# Patient Record
Sex: Female | Born: 1975 | Race: Black or African American | Hispanic: No | Marital: Single
Health system: Southern US, Community
[De-identification: ages and names within clinical notes are randomized; demographics above are authoritative.]

## PROBLEM LIST (undated history)

## (undated) DIAGNOSIS — K8020 Calculus of gallbladder without cholecystitis without obstruction: Secondary | ICD-10-CM

## (undated) HISTORY — PX: ABDOMINAL HYSTERECTOMY: SHX81

---

## 1998-03-26 ENCOUNTER — Inpatient Hospital Stay (HOSPITAL_COMMUNITY): Admission: AD | Admit: 1998-03-26 | Discharge: 1998-03-26 | Payer: Self-pay | Admitting: *Deleted

## 1998-12-22 ENCOUNTER — Inpatient Hospital Stay (HOSPITAL_COMMUNITY): Admission: AD | Admit: 1998-12-22 | Discharge: 1998-12-22 | Payer: Self-pay | Admitting: *Deleted

## 1999-03-04 ENCOUNTER — Ambulatory Visit (HOSPITAL_COMMUNITY): Admission: RE | Admit: 1999-03-04 | Discharge: 1999-03-04 | Payer: Self-pay | Admitting: *Deleted

## 1999-08-06 ENCOUNTER — Inpatient Hospital Stay (HOSPITAL_COMMUNITY): Admission: AD | Admit: 1999-08-06 | Discharge: 1999-08-09 | Payer: Self-pay | Admitting: *Deleted

## 1999-08-06 ENCOUNTER — Inpatient Hospital Stay (HOSPITAL_COMMUNITY): Admission: AD | Admit: 1999-08-06 | Discharge: 1999-08-06 | Payer: Self-pay | Admitting: *Deleted

## 2001-01-03 ENCOUNTER — Other Ambulatory Visit: Admission: RE | Admit: 2001-01-03 | Discharge: 2001-01-03 | Payer: Self-pay | Admitting: Family Medicine

## 2001-01-23 ENCOUNTER — Emergency Department (HOSPITAL_COMMUNITY): Admission: EM | Admit: 2001-01-23 | Discharge: 2001-01-23 | Payer: Self-pay | Admitting: Emergency Medicine

## 2001-02-18 ENCOUNTER — Emergency Department (HOSPITAL_COMMUNITY): Admission: EM | Admit: 2001-02-18 | Discharge: 2001-02-18 | Payer: Self-pay | Admitting: Emergency Medicine

## 2002-06-18 ENCOUNTER — Inpatient Hospital Stay (HOSPITAL_COMMUNITY): Admission: AD | Admit: 2002-06-18 | Discharge: 2002-06-18 | Payer: Self-pay | Admitting: Obstetrics and Gynecology

## 2002-06-21 ENCOUNTER — Inpatient Hospital Stay (HOSPITAL_COMMUNITY): Admission: AD | Admit: 2002-06-21 | Discharge: 2002-06-21 | Payer: Self-pay | Admitting: *Deleted

## 2002-06-24 ENCOUNTER — Inpatient Hospital Stay (HOSPITAL_COMMUNITY): Admission: AD | Admit: 2002-06-24 | Discharge: 2002-06-24 | Payer: Self-pay | Admitting: *Deleted

## 2002-10-14 ENCOUNTER — Inpatient Hospital Stay (HOSPITAL_COMMUNITY): Admission: AD | Admit: 2002-10-14 | Discharge: 2002-10-14 | Payer: Self-pay | Admitting: *Deleted

## 2002-10-14 ENCOUNTER — Encounter: Payer: Self-pay | Admitting: *Deleted

## 2003-01-13 ENCOUNTER — Ambulatory Visit (HOSPITAL_COMMUNITY): Admission: RE | Admit: 2003-01-13 | Discharge: 2003-01-13 | Payer: Self-pay | Admitting: *Deleted

## 2003-06-05 ENCOUNTER — Inpatient Hospital Stay (HOSPITAL_COMMUNITY): Admission: AD | Admit: 2003-06-05 | Discharge: 2003-06-05 | Payer: Self-pay | Admitting: *Deleted

## 2003-06-08 ENCOUNTER — Encounter (INDEPENDENT_AMBULATORY_CARE_PROVIDER_SITE_OTHER): Payer: Self-pay | Admitting: Specialist

## 2003-06-08 ENCOUNTER — Inpatient Hospital Stay (HOSPITAL_COMMUNITY): Admission: AD | Admit: 2003-06-08 | Discharge: 2003-06-11 | Payer: Self-pay | Admitting: Family Medicine

## 2004-07-18 ENCOUNTER — Inpatient Hospital Stay (HOSPITAL_COMMUNITY): Admission: AD | Admit: 2004-07-18 | Discharge: 2004-07-18 | Payer: Self-pay | Admitting: *Deleted

## 2006-06-05 ENCOUNTER — Emergency Department (HOSPITAL_COMMUNITY): Admission: EM | Admit: 2006-06-05 | Discharge: 2006-06-05 | Payer: Self-pay | Admitting: Family Medicine

## 2008-11-29 ENCOUNTER — Emergency Department (HOSPITAL_COMMUNITY): Admission: EM | Admit: 2008-11-29 | Discharge: 2008-11-29 | Payer: Self-pay | Admitting: Family Medicine

## 2009-10-19 ENCOUNTER — Emergency Department (HOSPITAL_COMMUNITY): Admission: EM | Admit: 2009-10-19 | Discharge: 2009-10-19 | Payer: Self-pay | Admitting: Emergency Medicine

## 2009-10-21 ENCOUNTER — Ambulatory Visit: Payer: Self-pay | Admitting: Obstetrics and Gynecology

## 2009-10-21 ENCOUNTER — Inpatient Hospital Stay (HOSPITAL_COMMUNITY): Admission: AD | Admit: 2009-10-21 | Discharge: 2009-10-21 | Payer: Self-pay | Admitting: Obstetrics & Gynecology

## 2010-03-04 ENCOUNTER — Encounter: Payer: Self-pay | Admitting: Family Medicine

## 2010-03-04 ENCOUNTER — Ambulatory Visit (HOSPITAL_COMMUNITY)
Admission: RE | Admit: 2010-03-04 | Discharge: 2010-03-04 | Payer: Self-pay | Source: Home / Self Care | Admitting: Family Medicine

## 2010-03-29 ENCOUNTER — Inpatient Hospital Stay (HOSPITAL_COMMUNITY)
Admission: AD | Admit: 2010-03-29 | Discharge: 2010-03-29 | Payer: Self-pay | Source: Home / Self Care | Attending: Obstetrics and Gynecology | Admitting: Obstetrics and Gynecology

## 2010-06-21 ENCOUNTER — Inpatient Hospital Stay (HOSPITAL_COMMUNITY)
Admission: AD | Admit: 2010-06-21 | Discharge: 2010-06-21 | Disposition: A | Discharge status: Home / Self Care | Payer: Medicaid Other | Source: Ambulatory Visit | Attending: Obstetrics & Gynecology | Admitting: Obstetrics & Gynecology

## 2010-06-21 DIAGNOSIS — O479 False labor, unspecified: Secondary | ICD-10-CM | POA: Insufficient documentation

## 2010-06-23 ENCOUNTER — Inpatient Hospital Stay (HOSPITAL_COMMUNITY)
Admission: AD | Admit: 2010-06-23 | Discharge: 2010-06-27 | DRG: 766 | Disposition: A | Discharge status: Home / Self Care | Payer: Medicaid Other | Source: Ambulatory Visit | Attending: Obstetrics & Gynecology | Admitting: Obstetrics & Gynecology

## 2010-06-23 DIAGNOSIS — Z302 Encounter for sterilization: Secondary | ICD-10-CM

## 2010-06-23 DIAGNOSIS — O34219 Maternal care for unspecified type scar from previous cesarean delivery: Principal | ICD-10-CM | POA: Diagnosis present

## 2010-06-23 DIAGNOSIS — O9902 Anemia complicating childbirth: Secondary | ICD-10-CM | POA: Diagnosis present

## 2010-06-23 DIAGNOSIS — D573 Sickle-cell trait: Secondary | ICD-10-CM | POA: Diagnosis present

## 2010-06-23 DIAGNOSIS — O480 Post-term pregnancy: Secondary | ICD-10-CM | POA: Diagnosis present

## 2010-06-23 LAB — RPR: RPR Ser Ql: NONREACTIVE

## 2010-06-23 LAB — CBC
HCT: 35 % — ABNORMAL LOW (ref 36.0–46.0)
RDW: 14.3 % (ref 11.5–15.5)
WBC: 9.4 10*3/uL (ref 4.0–10.5)

## 2010-06-24 ENCOUNTER — Other Ambulatory Visit: Payer: Self-pay | Admitting: Obstetrics and Gynecology

## 2010-06-24 DIAGNOSIS — O9902 Anemia complicating childbirth: Secondary | ICD-10-CM

## 2010-06-24 DIAGNOSIS — O34219 Maternal care for unspecified type scar from previous cesarean delivery: Secondary | ICD-10-CM

## 2010-06-24 DIAGNOSIS — D573 Sickle-cell trait: Secondary | ICD-10-CM

## 2010-06-25 LAB — CBC
MCV: 84.5 fL (ref 78.0–100.0)
Platelets: 141 10*3/uL — ABNORMAL LOW (ref 150–400)
RBC: 3.54 MIL/uL — ABNORMAL LOW (ref 3.87–5.11)
WBC: 16.2 10*3/uL — ABNORMAL HIGH (ref 4.0–10.5)

## 2010-06-27 LAB — URINALYSIS, ROUTINE W REFLEX MICROSCOPIC
Bilirubin Urine: NEGATIVE
Nitrite: NEGATIVE
Specific Gravity, Urine: <1.005 — ABNORMAL LOW (ref 1.005–1.030)
Urobilinogen, UA: 0.2 mg/dL (ref 0.0–1.0)
pH: 6.5 (ref 5.0–8.0)

## 2010-06-27 NOTE — Op Note (Addendum)
[name], [name]                 ACCOUNT NO.:  [id number]  MEDICAL RECORD NO.:  [id number]           PATIENT TYPE:  I  LOCATION:  9118                          FACILITY:  [facility]  PHYSICIAN:  Catalina Antigua, MD     DATE OF BIRTH:  11/13/1975  DATE OF PROCEDURE:  06/24/2010 DATE OF DISCHARGE:                              OPERATIVE REPORT   PREOPERATIVE DIAGNOSES:  This is a 35 year old gravida 6, para 3-0-2-3 who was at 82 and 2 weeks' estimated gestational age.  She was in induction of labor with failure to progress and fetal intolerance of labor with repetitive late and variable decelerations as well as undesired fertility.  POSTOPERATIVE DIAGNOSES:  This is a 35 year old gravida 6, para 3-0-2-3 who was at 73 and 2 weeks' estimated gestational age.  She was in induction of labor with failure to progress and fetal intolerance of labor with repetitive late and variable decelerations as well as undesired fertility.  PROCEDURE:  Repeat low transverse cesarean section with bilateral tubal ligation by the Pomeroy method.  SURGEON:  Catalina Antigua, MD and Lucina Mellow, DO  ANESTHESIA:  Epidural.  FINDINGS:  Many adhesions, a female infant in vertex presentation, Apgars 7 at one minute and 9 at five minutes, weight 6 pounds 13 ounces. Cord pH sample was clotted and unable to be processed.  Normal uterus, tubes, ovaries and placenta.  SPECIMENS:  Placenta to Labor and Delivery.  Tubes bilaterally to Pathology.  ESTIMATED BLOOD LOSS:  600 mL.  URINE OUTPUT:  300 mL and clear.  FLUIDS:  1200 mL.  COMPLICATIONS:  None.  INDICATIONS:  This is a 35 year old gravida 6, para 3-0-2-3 who was in induction of labor for nonreactive NST for her postdates NST yesterday and she was started with induction of labor.  She was placed on Pitocin and fetal heart tracing kept dropping with variable decelerations and late decelerations with Pitocin and there was noted to be failure  to progress.  Cesarean section was decided to proceed.  PROCEDURE:  The patient was taken to the operating room where epidural anesthesia was found to be adequate.  She was prepared and draped in the normal sterile fashion in the dorsal supine position with a leftward tilt.  A Pfannenstiel skin incision was then made with the scalpel and carried through to the underlying fascia with the Bovie.  The fascia was incised in the midline and incision was extended laterally with Mayo scissors.  The superior aspect of the fascial incision was grasped with Kocher clamps, elevated and the underlying rectus muscles were dissected off bluntly and with the Mayo scissors.  Attention was then turned to the inferior aspect of the incision which in a similar fashion was grasped, tented up with Kocher clamps and rectus muscles dissected off bluntly with Mayo scissors.  The rectus muscles were separated in the midline with lysis of adhesions.  The peritoneum was identified and entered with use of Metzenbaum scissors and hemostats.  Adhesions were broken down.  The peritoneal incision was extended with breaking down of the adhesions.  The bladder blade was inserted and the peritoneum  was identified and a bladder flap was made.  The bladder blade was reinserted and a thick uterine adhesion was lysed and then the uterine segment was then incised in a transverse fashion with a scalpel.  The uterine incision was then extended laterally and upwardly with bandage scissors.  The bladder blade was removed and the infant's head was delivered atraumatically with some vaginal upward pressure on the infant's head.  The shoulders and body were delivered without difficulty.  The nose and mouth were suctioned with a bulb suction on the field and the cord was clamped and cut.  The infant was given to the awaiting pediatrician and a cord gas sample was obtained that had clotted off.  The cord placenta delivered without  difficulty and was passed off for cord blood donation.  The uterus was cleared of all clots and debris.  The uterine incision was repaired with Vicryl in a running locked fashion.  A second layer of the same suture was then used in a imbricating fashion to obtain excellent hemostasis.  The gutters were cleared of all clots and were irrigated.  Attention was then turned for the tubal and adhesions were lysed to reveal the patient's left tube.  It was followed out to the fimbria.  A Babcock clamp was then used to grasp the tube approximately 4 cm from the cornual region.  A 3 cm segment of the tube was ligated with a free tie of plain gut x2 and excised.  Good hemostasis was noted and the tube was returned to the abdomen.  The right fallopian tube was then identified in a similar fashion, followed out to the fimbria and was ligated with a free tie of plain gut and a 3 cm segment was excised in similar fashion.  Excellent hemostasis was noted and the tube was returned to the abdomen.  The gutters again were cleared of all clots and then the fascia was reapproximated with 0 Vicryl in a running fashion.  The skin was closed with staples.  The patient tolerated the procedure well.  Sponge, lap and needle counts were correct x3.  The patient had received 2 g of Ancef prior to procedure and the patient was taken to the recovery room in stable condition.  The infant went to the nursery in stable condition.    ______________________________ Lucina Mellow, DO   ______________________________ Catalina Antigua, MD    SH/MEDQ  D:  06/24/2010  T:  06/25/2010  Job:  [id number]  Electronically Signed by Lucina Mellow MD on 06/27/2010 11:14:52 AM Electronically Signed by Catalina Antigua  on 07/05/2010 02:05:23 PM

## 2010-07-03 LAB — URINALYSIS, ROUTINE W REFLEX MICROSCOPIC
Nitrite: POSITIVE — AB
Protein, ur: NEGATIVE mg/dL
Specific Gravity, Urine: 1.02 (ref 1.005–1.030)
Urobilinogen, UA: 0.2 mg/dL (ref 0.0–1.0)

## 2010-07-03 LAB — URINE CULTURE: Colony Count: >=100000

## 2010-07-03 LAB — URINE MICROSCOPIC-ADD ON

## 2010-07-15 NOTE — Discharge Summary (Signed)
  [name], [name]                 ACCOUNT NO.:  [id number]  MEDICAL RECORD NO.:  [id number]           PATIENT TYPE:  I  LOCATION:  9118                          [facility]ILITY:  [facility]  PHYSICIAN:  Allie Bossier, MD        DATE OF BIRTH:  30-Jan-1976  DATE OF ADMISSION:  06/23/2010 DATE OF DISCHARGE:  06/27/2010                              DISCHARGE SUMMARY   REASON FOR ADMISSION:  Pregnancy at term and reactive labor.  SURGICAL PROCEDURE PERFORMED:  Repeat low transverse cesarean section and bilateral tubal ligation with Pomeroy procedure by Dr. Jolayne Panther and Dr. Natale Milch.  HOSPITAL COURSE:  The patient was up ambulating well, taking p.o. fluids and solids well, has had a bowel movement already, and vital signs are stable.  FOLLOWUP:  [address] [facility] nurse is to follow up on postop day 5 through 7 to take out her staples.  She is to follow up at [facility] in 6 weeks postpartum for her postpartum checkup.  DIET:  Diet as tolerated.  ACTIVITY LEVEL:  No heavy lifting or driving for 2 weeks postop.  PHYSICAL EXAMINATION:  VITAL SIGNS:  This morning, vital signs were stable. HEART:  Regular rhythm and rate. LUNGS:  Clear to auscultation bilaterally. ABDOMEN:  Soft.  Bowel sounds present in all four quadrants.  Incision is intact.  There is no redness, swelling or drainage.  Fundus is firm. Lochia small amount. EXTREMITIES:  No edema in lower extremities.  DISCHARGE MEDICATIONS: 1. Percocet 5/325 one p.o. q.4 h p.r.n. pain. 2. Motrin 600 p.o. q.6 h p.r.n. cramping.  Discharge hemoglobin is 9.9, discharge hematocrit is 29.9, white count 16.2, platelets are 141,000.     [name], N.M.   ______________________________ Allie Bossier, MD    DL/MEDQ  D:  16/01/9603  T:  06/27/2010  Job:  [id number]  cc:   [facility]  Electronically Signed by Wyvonnia Dusky N.M. on 07/03/2010 09:14:13 AM Electronically Signed by Nicholaus Bloom MD on  07/15/2010 11:29:58 AM

## 2010-07-31 ENCOUNTER — Inpatient Hospital Stay (HOSPITAL_COMMUNITY): Admission: AD | Admit: 2010-07-31 | Payer: Self-pay | Admitting: Obstetrics & Gynecology

## 2010-09-02 NOTE — Discharge Summary (Signed)
[name], [name]                           ACCOUNT NO.:  [id number]   MEDICAL RECORD NO.:  [id number]                   PATIENT TYPE:  INP   LOCATION:  9130                                 [facility]ILITY[facility]  [facility]   PHYSICIAN:  Lesly Dukes, M.D.              DATE OF BIRTH:  1975-08-07   DATE OF ADMISSION:  06/08/2003  DATE OF DISCHARGE:  06/11/2003                                 DISCHARGE SUMMARY   DISCHARGE DIAGNOSES:  1. Primary low transverse cesarean section at 39 weeks secondary to failure     to progress.  2. Muscle spasm of the right shoulder.   DISCHARGE MEDICATIONS:  1. Ibuprofen 600 mg p.o. q.6h. p.r.n., #20.  2. Percocet 5/325 one p.o. q.4h. p.r.n., #20.  3. Flexeril 5 mg p.o. t.i.d. x2 weeks.  4. Depo-Provera shot given before discharge on June 11, 2003.   FOLLOW UP:  The patient has an appointment at [facility] in 6 weeks.   BRIEF ADMISSION HISTORY:  This is a 35 year old African-American female, G5,  P3-0-1-3, who presented at 80 and 1 with onset of labor.  The patient had a  history of being GBS positive.  Cervical exam was 3 cm and 70% on admission.   HOSPITAL COURSE:  The patient received an epidural.  External fetal  monitoring was reassuring initially.  The patient had artificial rupture of  membranes with thick meconium.  Amnioinfusion was started.  The patient had  variable decelerations with a few late decelerations about 1430 on June 08, 2003.  Amnioinfusion was stopped secondary to no return, and the patient  was started on ampicillin and gentamicin with a temperature of 99.9.  The  patient's cervix did not change after 3-1/2 hours, and it was decided to  take the patient for a cesarean section.  She had a primary low transverse.  Delivery of the infant was on June 08, 2003 at 1649.  Delivery of  placenta at 1652.  Apgars of 1 minute of 4, Apgars at 5 minutes of 8.  She  had a female infant.  The patient is bottle feeding.  The infant  weighs 7  pounds, 0 ounces, with length of 20 inches.  She received penicillin before  being started on the ampicillin and gentamicin earlier in labor.  The  patient and infant stable at discharge.  No complications postoperatively.  The patient did have muscle spasm of right trapezius.  She received heat and  Tylenol during hospitalization, without improvement.  The patient was sent  home with Flexeril 5 mg p.o. t.i.d. for 2 weeks.     Anastasio Auerbach, MD                          Lesly Dukes, M.D.    AD/MEDQ  D:  06/11/2003  T:  06/11/2003  Job:  [id number]

## 2010-09-02 NOTE — Op Note (Signed)
[name], [name]                           ACCOUNT NO.:  [id number]   MEDICAL RECORD NO.:  [id number]                   PATIENT TYPE:  INP   LOCATION:  9130                                 [facility]ILITY:  [facility]   PHYSICIAN:  Lesly Dukes, M.D.              DATE OF BIRTH:  05-31-1975   DATE OF PROCEDURE:  06/08/2003  DATE OF DISCHARGE:                                 OPERATIVE REPORT   PROCEDURE:  Low transverse cesarean section.   PRIMARY SURGEON:  Dr. Penne Lash.   FIRST ASSISTANT:  Dr. Gala Murdoch.   INDICATION:  Failure to progress.  Nonreassuring fetal heart tones.   DESCRIPTION OF PROCEDURE:  Informed consent for cesarean section was  obtained and verified from patient.  The patient was brought back to the  operating room and prepared on the OR table.  The patient had epidural  anesthesia prior to coming back to the OR.  The patient was prepped with an  iodine solution on the abdomen and then was sterilely draped in the usual  fashion.  Adequate anesthesia was tested with Allis clamps prior to  beginning procedure.  Pfannenstiel skin incision was performed using a 15  blade scalpel.  Subcutaneous tissues were dissected down to the fascia using  the Bovie.  Fascia was incised using the Bovie, and fascial incision was  adequately widened using Mayo scissors.  Fascia was elevated with Kocher  clamps, and rectus muscles were dissected off the fascia both inferiorly and  superiorly.  The peritoneum was entered bluntly and peritoneum and rectus  muscles were stretched open laterally.  Bladder blade was inserted, and a  bladder flap was created on the vesicouterine peritoneum using Metzenbaum  scissors.  Bladder flap was retracted down underneath bladder blade.  Uterus  was entered sharply using 15 scalpel and after initial midline incision,  uterine incision was extended superior laterally using bandage scissors in  order to gain adequate access for removal of fetus.  The fetal head was  delivered and was DeLee suctioned on the abdomen prior to delivering the  body.  Nuchal cord was decompressed x 1, and fetus was delivered, a female  fetus at 11.  Cord was clamped and cut and infant with Apgars 4 at 1  minute and 8 at 4 minutes was handed to the waiting MICU team.  Cord gas,  both arterial and venous was sent as well as cord blood.  Placenta delivered  spontaneously directly after delivery of the fetus.  Uterine cavity was  swept with a dry lap in order to assure that all placental membranes had  been removed.  Uterus was then massaged until firm.  Margins of the uterine  incision were identified, and Allis clamps were placed on the margins.  Uterine incision was then closed using a running locking 0 Vicryl suture.  Two 0 Vicryl sutures were used to close the incision.  An additional  figure-  of-eight stitch was added above the incision, the right lateral side for  better hemostasis.  Peritoneal cavity was lavaged with saline solution to  verify that adequate hemostasis had been obtained. After adequate hemostasis  had been obtained, fascial layer was closed using 0 Vicryl suture.  Subcutaneous tissues were then irrigated to assess for hemostasis.  Subcutaneous tissue once was verified, the skin incision was closed using  staples.  Estimated blood loss for the procedure was 1000 mL.  Mother was in  stable condition at the end of the procedure.     [name], MD                          Lesly Dukes, M.D.    CH/MEDQ  D:  06/08/2003  T:  06/08/2003  Job:  [id number]

## 2010-11-23 ENCOUNTER — Other Ambulatory Visit: Payer: Self-pay | Admitting: Internal Medicine

## 2010-11-23 DIAGNOSIS — N926 Irregular menstruation, unspecified: Secondary | ICD-10-CM

## 2010-11-29 ENCOUNTER — Ambulatory Visit
Admission: RE | Admit: 2010-11-29 | Discharge: 2010-11-29 | Disposition: A | Discharge status: Home / Self Care | Payer: Medicaid Other | Source: Ambulatory Visit | Attending: Internal Medicine | Admitting: Internal Medicine

## 2010-11-29 ENCOUNTER — Ambulatory Visit
Admission: RE | Admit: 2010-11-29 | Discharge: 2010-11-29 | Disposition: A | Discharge status: Home / Self Care | Payer: 59 | Source: Ambulatory Visit | Attending: Internal Medicine | Admitting: Internal Medicine

## 2010-11-29 DIAGNOSIS — N926 Irregular menstruation, unspecified: Secondary | ICD-10-CM

## 2012-05-19 IMAGING — US US OB DETAIL+14 WK
1 series · 12 of 28 positions shown · non-contrast
Comparison: none

[Series 1: us ob detail +14 wk · 12 of 52 slices shown]
[im 2/52]
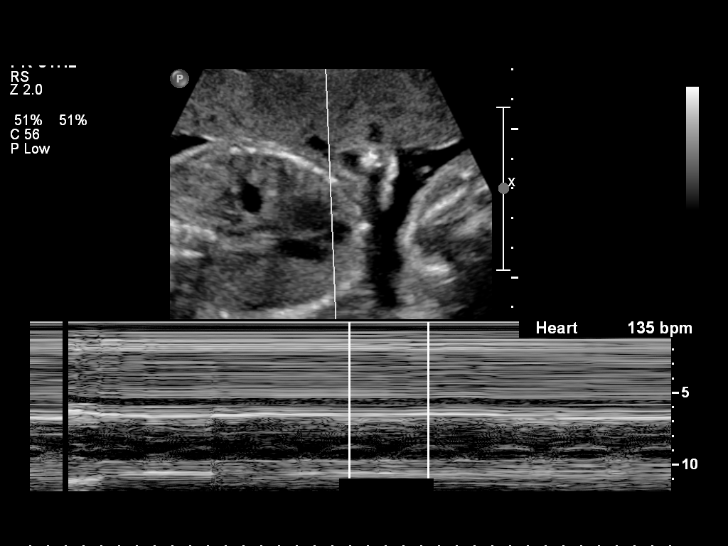
[im 6/52]
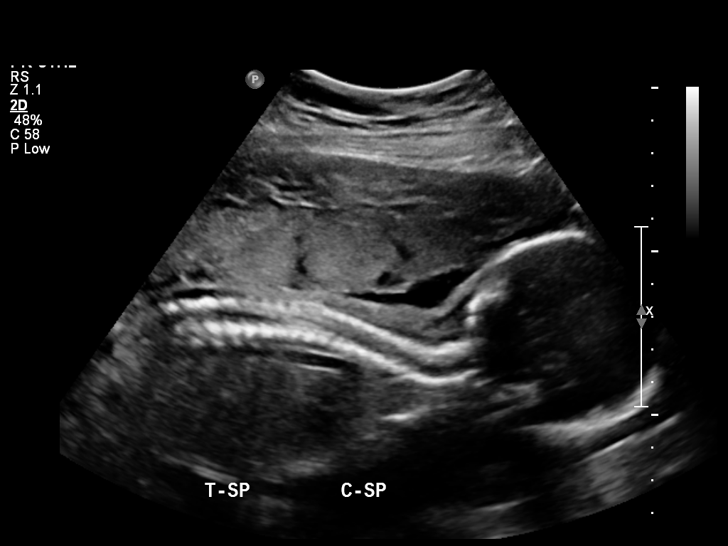
[im 10/52]
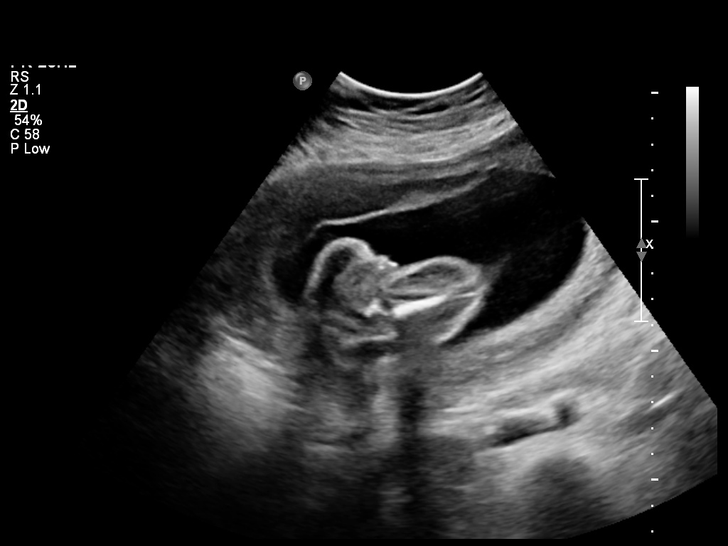
[im 16/52]
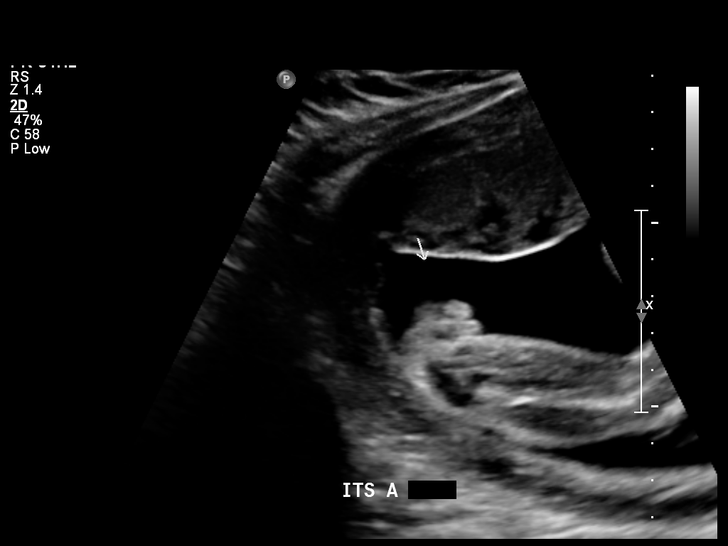
[im 19/52]
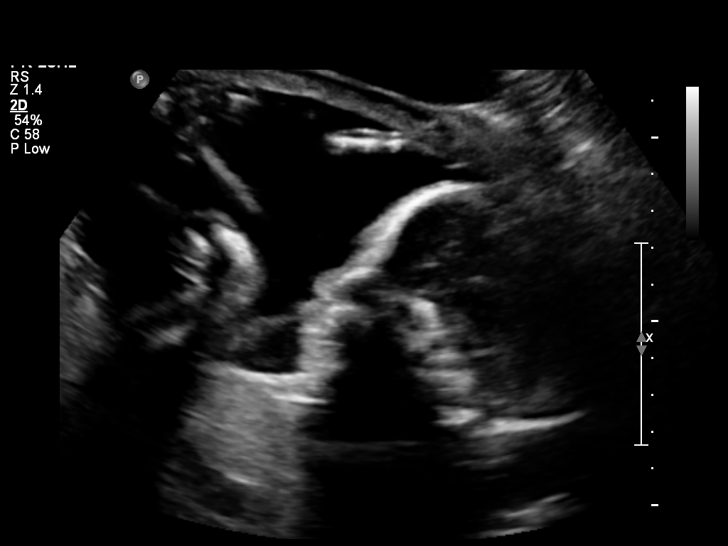
[im 23/52]
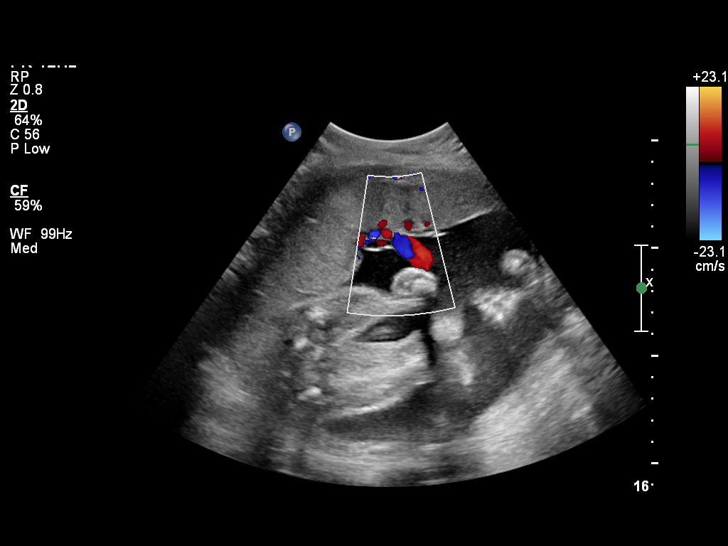
[im 29/52]
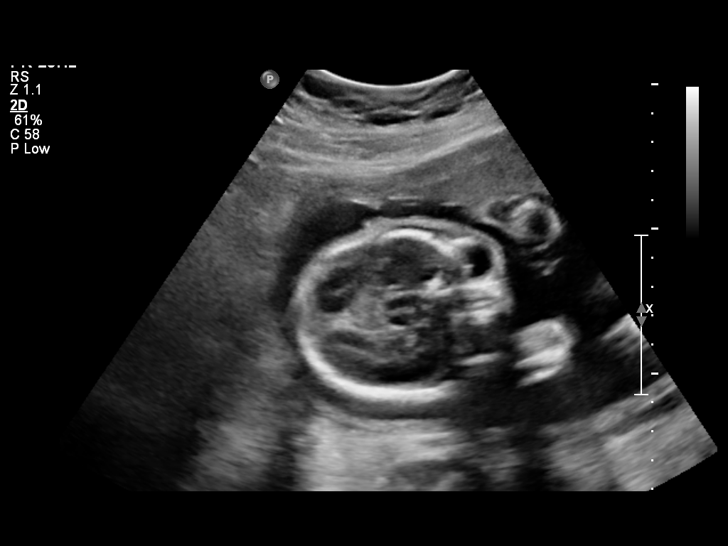
[im 33/52]
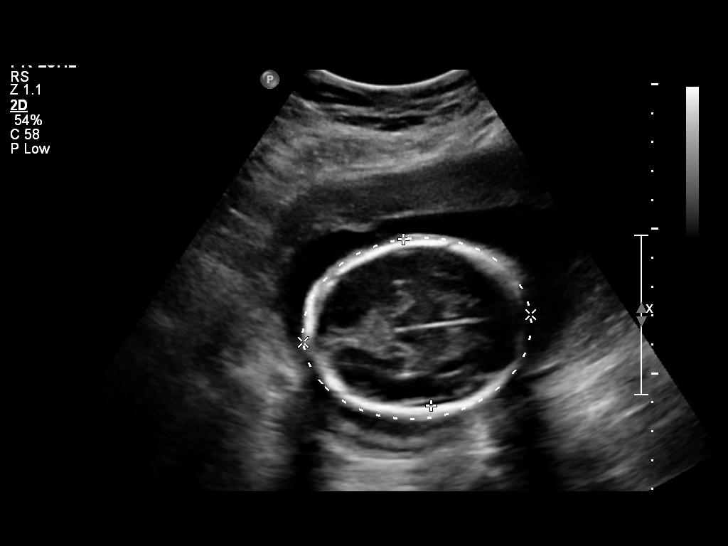
[im 36/52]
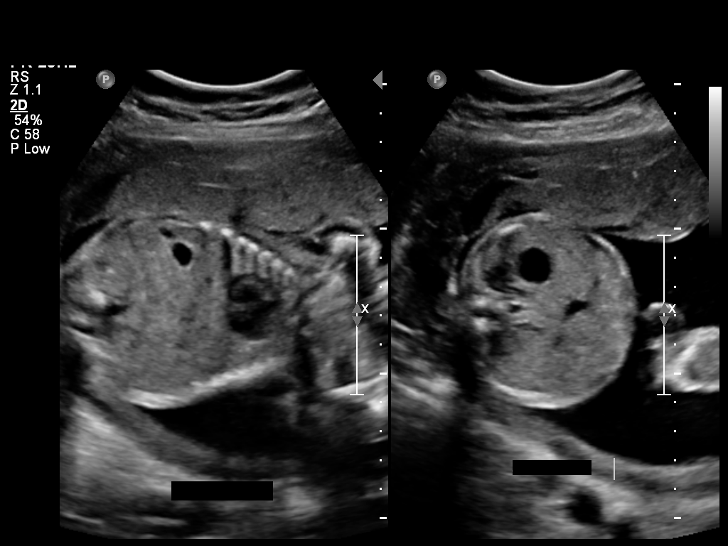
[im 42/52]
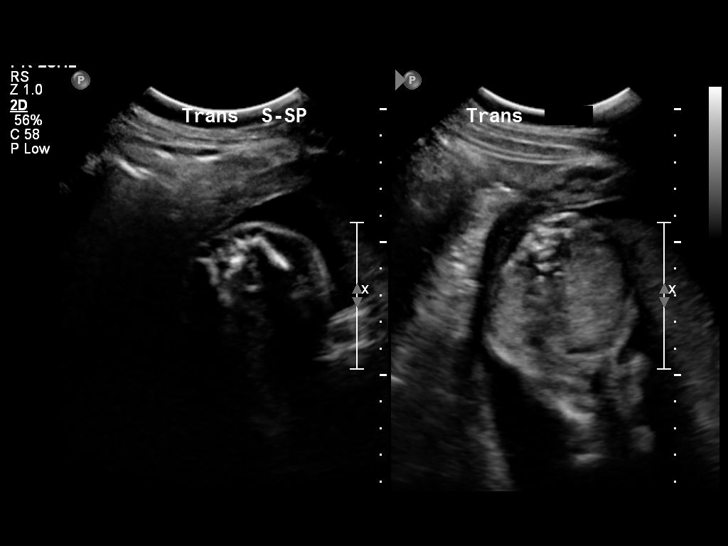
[im 46/52]
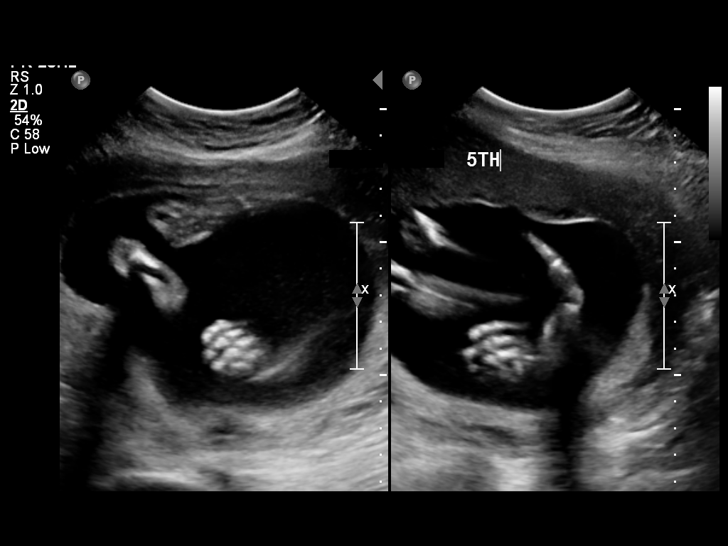
[im 50/52]
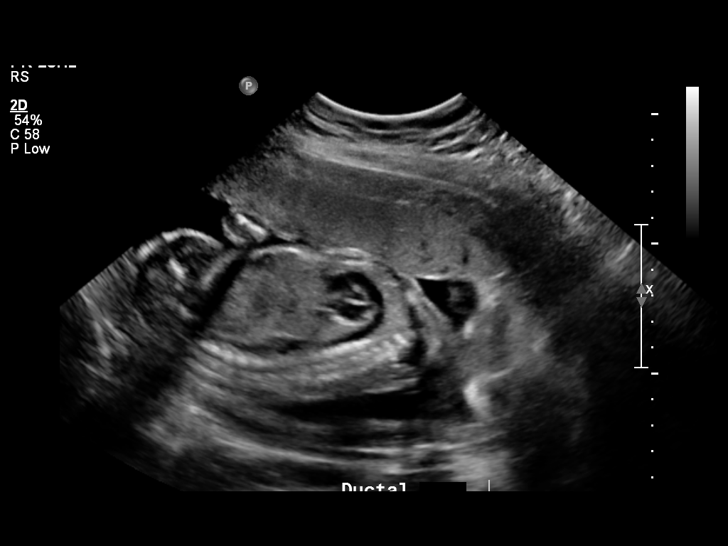

[12 of 28 positions shown; findings below may reference images not displayed]

OBSTETRICS REPORT
                      (Signed Final 03/04/2010 [DATE])

 Order#:         _O
Procedures

 US OB DETAIL +14 WK                                   76811.0
Indications

 Detailed fetal anatomic survey
Fetal Evaluation

 Fetal Heart Rate:  135                          bpm
 Cardiac Activity:  Observed
 Presentation:      Cephalic
 Placenta:          Anterior, above cervical os
 P. Cord            Visualized
 Insertion:

 Amniotic Fluid
 AFI FV:      Subjectively within normal limits
                                             Larg Pckt:     5.6  cm
Biometry

 BPD:     58.2  mm     G. Age:  23w 6d                CI:        69.16   70 - 86
                                                      FL/HC:      20.6   18.7 -

 HC:     223.5  mm     G. Age:  24w 2d       35  %    HC/AC:      1.11   1.05 -

 AC:     201.2  mm     G. Age:  24w 5d       56  %    FL/BPD:     79.0   71 - 87
 FL:        46  mm     G. Age:  25w 2d       68  %    FL/AC:      22.9   20 - 24
 HUM:     42.3  mm     G. Age:  25w 3d       69  %

 Est. FW:     742  gm    1 lb 10 oz      63  %
Gestational Age

 LMP:           24w 2d        Date:  09/15/09                 EDD:   06/22/10
 U/S Today:     24w 4d                                        EDD:   06/20/10
 Best:          24w 2d     Det. By:  LMP  (09/15/09)          EDD:   06/22/10
Anatomy

 Cranium:           Appears normal      Aortic Arch:       Appears normal
 Fetal Cavum:       Appears normal      Ductal Arch:       Appears normal
 Ventricles:        Appears normal      Diaphragm:         Appears normal
 Choroid Plexus:    Appears normal      Stomach:           Appears normal
 Cerebellum:        Appears normal      Abdomen:           Appears normal
 Posterior Fossa:   Appears normal      Abdominal Wall:    Appears nml
                                                           (cord insert,
                                                           abd wall)
 Nuchal Fold:       Appears normal      Cord Vessels:      Appears normal
                    (neck, nuchal                          (3 vessel cord)
                    fold)
 Face:              Lips and orbits     Kidneys:           Appear normal
                    appear normal
 Heart:             Appears normal      Bladder:           Appears normal
                    (4 chamber &
                    axis)
 RVOT:              Appears normal      Spine:             Appears normal
 LVOT:              Appears normal      Limbs:             Four extremities
                                                           seen

 Other:     Female gender. Heels and 5th digit visualized. Nasal
            bone visualized.
Cervix Uterus Adnexa

 Cervical Length:    3.87     cm

 Cervix:       Closed.

 Adnexa:     No abnormality visualized.
Impression

 Single live IUP in cephalic presentation.  Concordant
 measurements/assigned GA by LMP.
 No anatomic abnormality seen with a good quality survey
 possible.

 questions or concerns.

## 2013-02-13 IMAGING — US US PELVIS COMPLETE
1 series · 14 of 25 positions shown · non-contrast
Comparison: OB ultrasound 03/04/2010, pelvic ultrasound 07/18/2004

CLINICAL DATA: Menometrorrhagia

TRANSABDOMINAL AND TRANSVAGINAL ULTRASOUND OF PELVIS
TECHNIQUE: Both transabdominal and transvaginal ultrasound
examinations of the pelvis were performed. Transabdominal technique
was performed for global imaging of the pelvis including uterus,
ovaries, adnexal regions, and pelvic cul-de-sac.

[Series 1: us pelvis complete · 0.31mm/px · 14 of 47 slices shown]
[im 1/47]
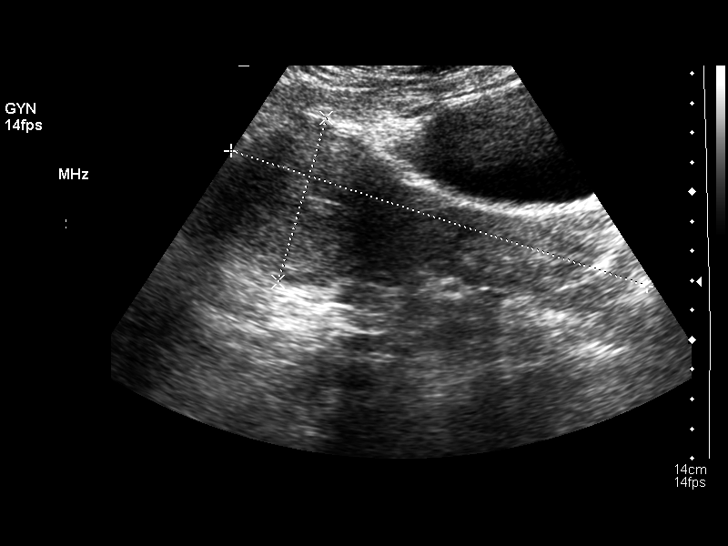
[im 4/47]
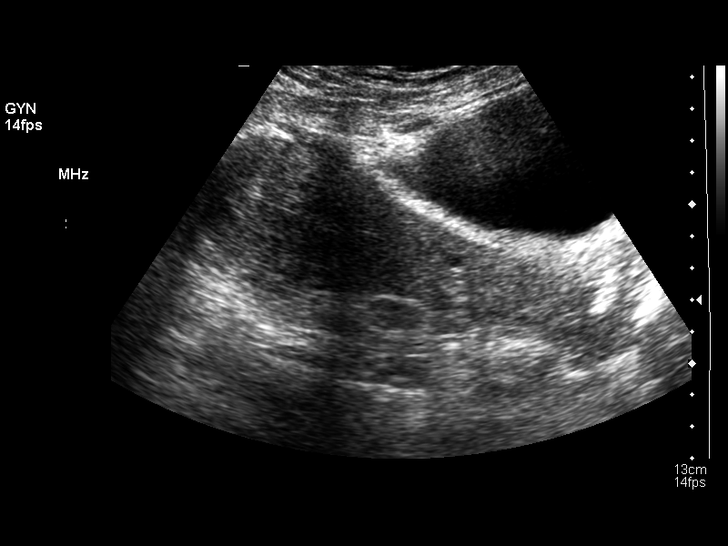
[im 8/47]
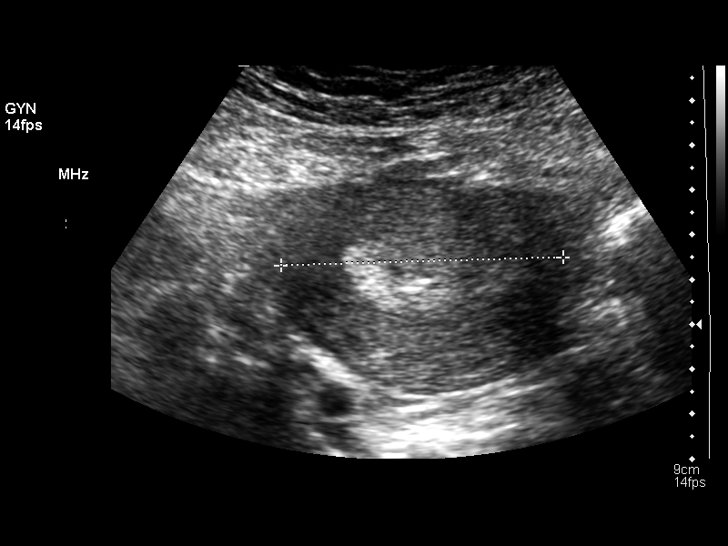
[im 12/47]
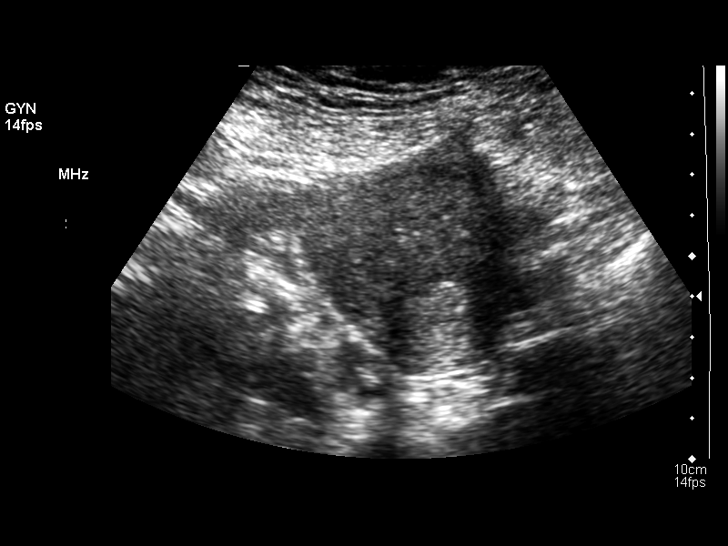
[im 16/47]
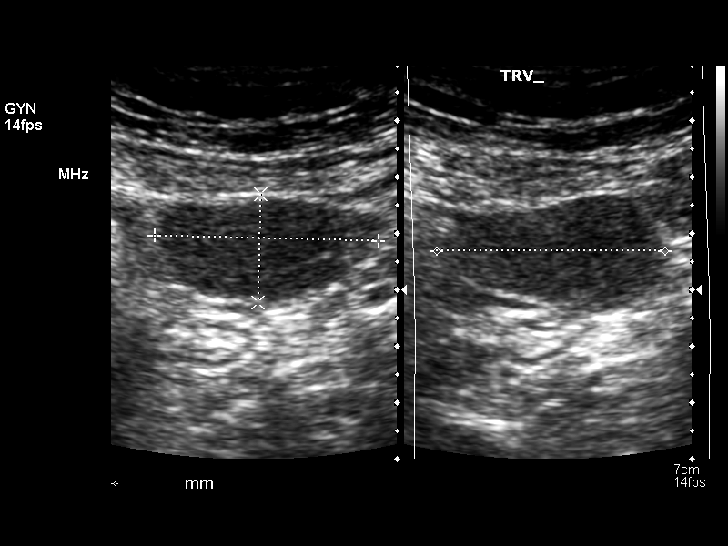
[im 18/47]
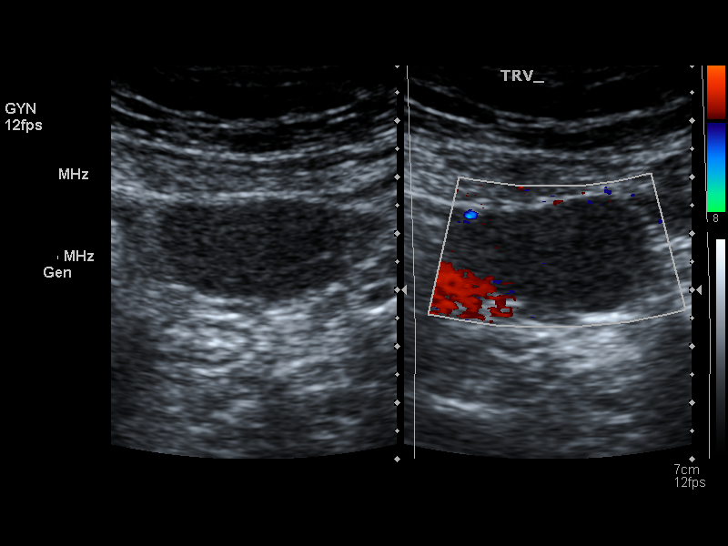
[im 22/47]
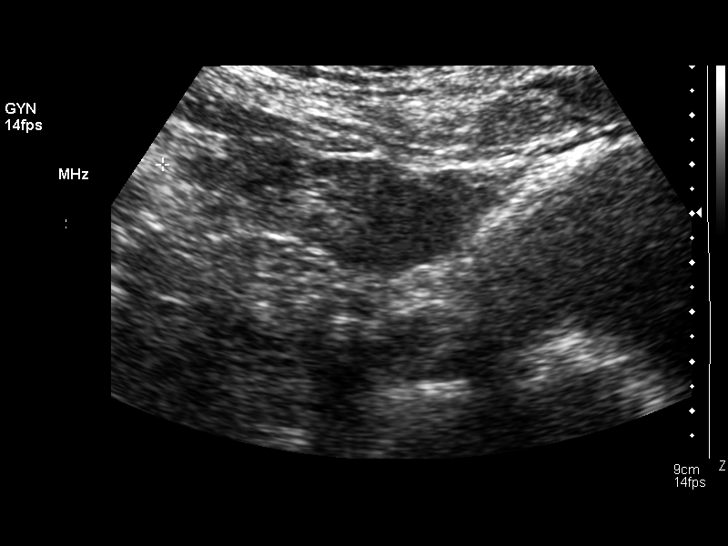
[im 25/47]
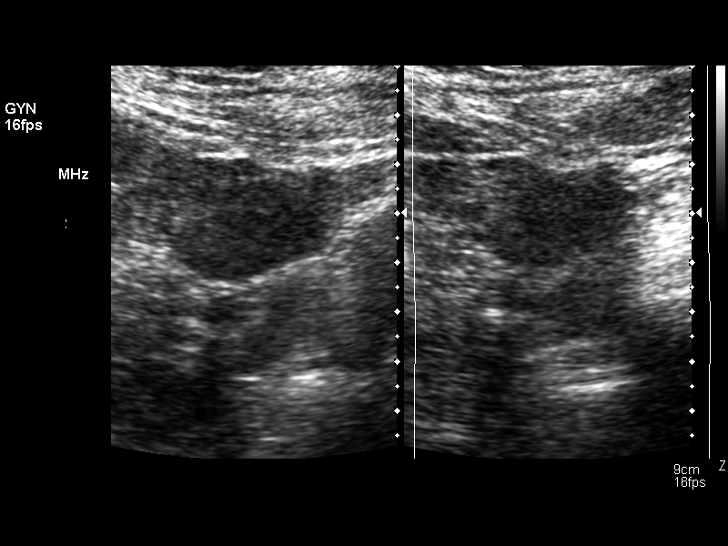
[im 29/47]
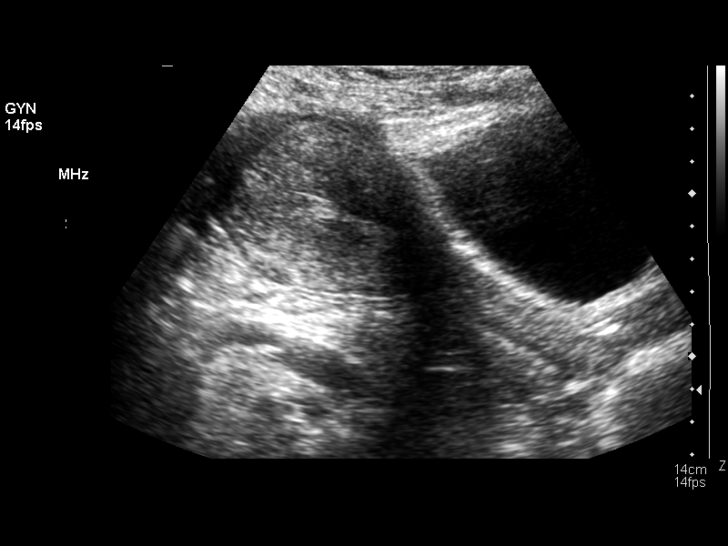
[im 31/47]
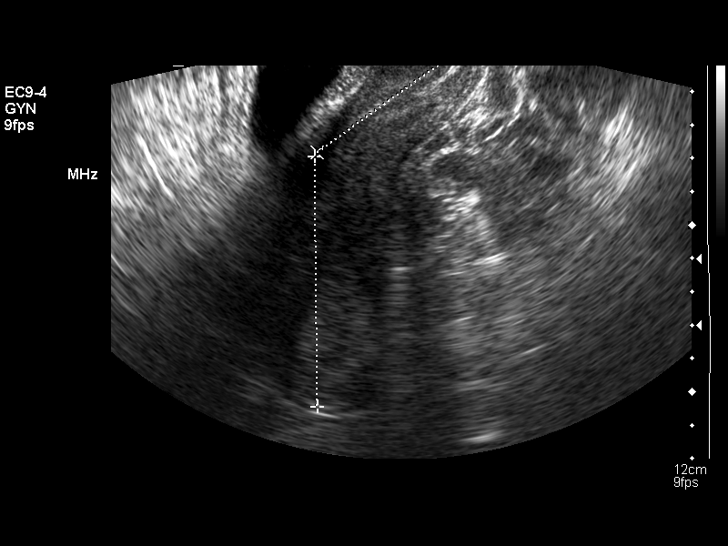
[im 35/47]
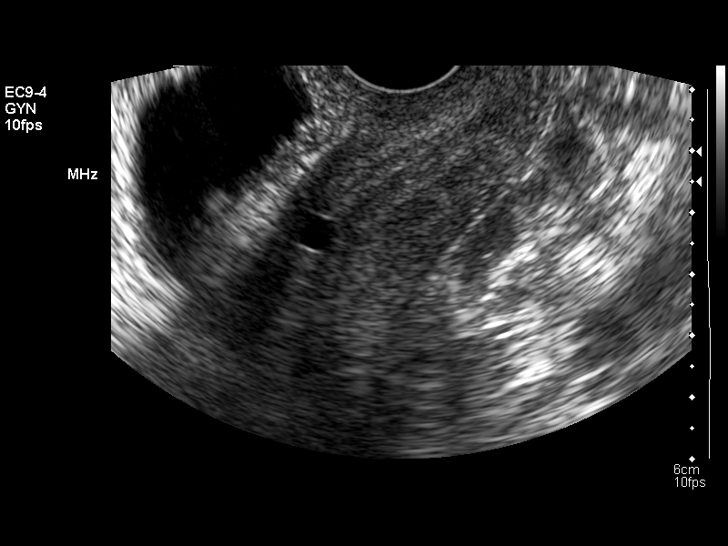
[im 39/47]
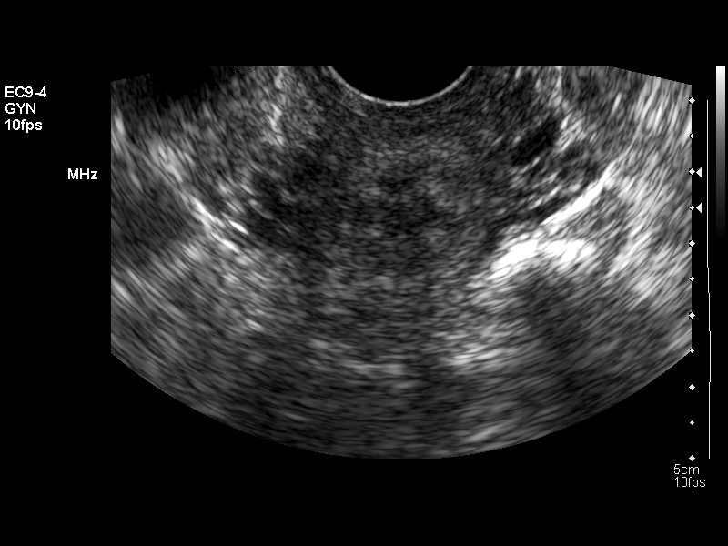
[im 43/47]
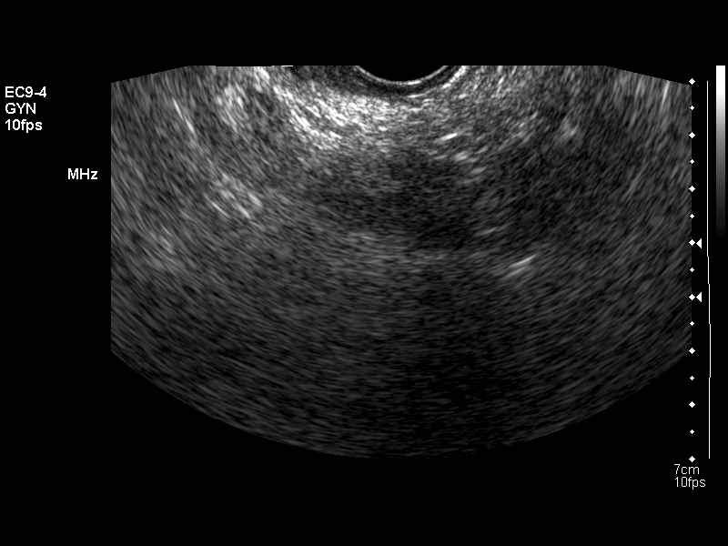
[im 47/47]
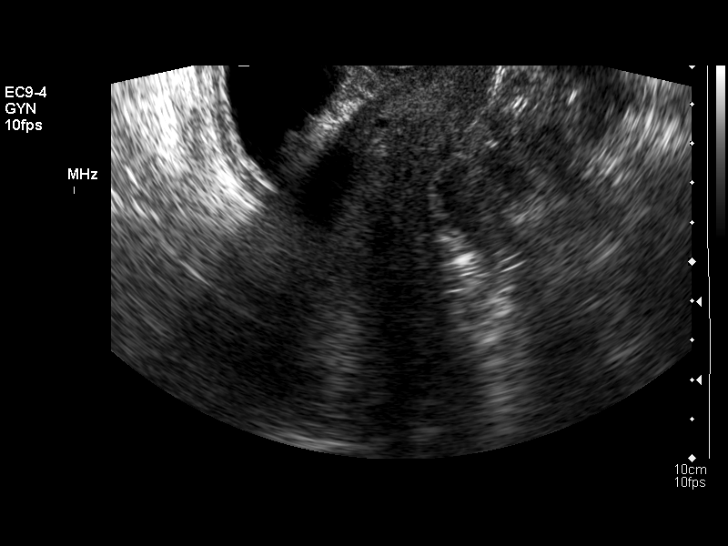

[14 of 25 positions shown; findings below may reference images not displayed]

It was necessary to proceed with endovaginal exam following the
transabdominal exam to visualize the endometrium.
FINDINGS: Uterus: 14.8 x 0.6 x 5.8 cm.  Anteverted, retroflexed.  This
renders visualization of the fundus suboptimal.

Endometrium: Fundal endometrium not well visualized transvaginally.
1.7 cm, inhomogeneously echogenic, best seen transabdominally.

Right ovary:  3.4 x 3.0 x 1.8 cm.  Normal.  Seen only
transabdominally.

Left ovary: 4.0 x 4.1 x 1.9 cm.  Normal.  Seen only
transabdominally.

Other findings: No free fluid
IMPRESSION: Endometrial stripe at upper limits of normal in thickness measuring
1.7 cm.  Consider follow-up during the week following the patient's
menses in 6-8 weeks.

Otherwise normal exam.

## 2021-09-21 ENCOUNTER — Emergency Department (HOSPITAL_COMMUNITY): Payer: PRIVATE HEALTH INSURANCE

## 2021-09-21 ENCOUNTER — Encounter (HOSPITAL_COMMUNITY): Payer: Self-pay

## 2021-09-21 ENCOUNTER — Emergency Department (HOSPITAL_COMMUNITY)
Admission: EM | Admit: 2021-09-21 | Discharge: 2021-09-21 | Disposition: A | Discharge status: Home / Self Care | Payer: PRIVATE HEALTH INSURANCE | Attending: Emergency Medicine | Admitting: Emergency Medicine

## 2021-09-21 ENCOUNTER — Other Ambulatory Visit: Payer: Self-pay

## 2021-09-21 DIAGNOSIS — K219 Gastro-esophageal reflux disease without esophagitis: Secondary | ICD-10-CM | POA: Diagnosis not present

## 2021-09-21 DIAGNOSIS — R1013 Epigastric pain: Secondary | ICD-10-CM | POA: Diagnosis present

## 2021-09-21 DIAGNOSIS — K2970 Gastritis, unspecified, without bleeding: Secondary | ICD-10-CM | POA: Diagnosis not present

## 2021-09-21 HISTORY — DX: Calculus of gallbladder without cholecystitis without obstruction: K80.20

## 2021-09-21 LAB — CBC
HCT: 34.7 % — ABNORMAL LOW (ref 36.0–46.0)
Hemoglobin: 11.8 g/dL — ABNORMAL LOW (ref 12.0–15.0)
MCH: 32.1 pg (ref 26.0–34.0)
MCHC: 34 g/dL (ref 30.0–36.0)
MCV: 94.3 fL (ref 80.0–100.0)
Platelets: 243 10*3/uL (ref 150–400)
RBC: 3.68 MIL/uL — ABNORMAL LOW (ref 3.87–5.11)
RDW: 12.7 % (ref 11.5–15.5)
WBC: 6.5 10*3/uL (ref 4.0–10.5)
nRBC: 0 % (ref 0.0–0.2)

## 2021-09-21 LAB — COMPREHENSIVE METABOLIC PANEL
ALT: 21 U/L (ref 0–44)
AST: 14 U/L — ABNORMAL LOW (ref 15–41)
Albumin: 4.2 g/dL (ref 3.5–5.0)
Alkaline Phosphatase: 76 U/L (ref 38–126)
Anion gap: 7 (ref 5–15)
BUN: 9 mg/dL (ref 6–20)
CO2: 27 mmol/L (ref 22–32)
Calcium: 8.4 mg/dL — ABNORMAL LOW (ref 8.9–10.3)
Chloride: 108 mmol/L (ref 98–111)
Creatinine, Ser: 0.68 mg/dL (ref 0.44–1.00)
GFR, Estimated: >60 mL/min (ref >60)
Glucose, Bld: 107 mg/dL — ABNORMAL HIGH (ref 70–99)
Potassium: 3.2 mmol/L — ABNORMAL LOW (ref 3.5–5.1)
Sodium: 142 mmol/L (ref 135–145)
Total Bilirubin: 0.7 mg/dL (ref 0.3–1.2)
Total Protein: 7.1 g/dL (ref 6.5–8.1)

## 2021-09-21 LAB — URINALYSIS, ROUTINE W REFLEX MICROSCOPIC
Bacteria, UA: NONE SEEN
Bilirubin Urine: NEGATIVE
Glucose, UA: NEGATIVE mg/dL
Hgb urine dipstick: NEGATIVE
Ketones, ur: NEGATIVE mg/dL
Nitrite: NEGATIVE
Protein, ur: NEGATIVE mg/dL
Specific Gravity, Urine: 1.018 (ref 1.005–1.030)
pH: 5 (ref 5.0–8.0)

## 2021-09-21 LAB — LIPASE, BLOOD: Lipase: 26 U/L (ref 11–51)

## 2021-09-21 LAB — I-STAT BETA HCG BLOOD, ED (MC, WL, AP ONLY): I-stat hCG, quantitative: <5 m[IU]/mL (ref <5)

## 2021-09-21 MED ORDER — FAMOTIDINE IN NACL 20-0.9 MG/50ML-% IV SOLN
20.0000 mg | Freq: Once | INTRAVENOUS | Status: AC
  Administered 2021-09-21: 20 mg via INTRAVENOUS
  Filled 2021-09-21: qty 50

## 2021-09-21 MED ORDER — SODIUM CHLORIDE 0.9 % IV BOLUS
1000.0000 mL | Freq: Once | INTRAVENOUS | Status: AC
Start: 2021-09-21 — End: 2021-09-21
  Administered 2021-09-21: 1000 mL via INTRAVENOUS

## 2021-09-21 MED ORDER — ONDANSETRON HCL 4 MG/2ML IJ SOLN
4.0000 mg | Freq: Once | INTRAMUSCULAR | Status: AC
  Administered 2021-09-21: 4 mg via INTRAVENOUS
  Filled 2021-09-21: qty 2

## 2021-09-21 NOTE — ED Provider Notes (Signed)
[facility]-EMERGENCY DEPT Provider Note   CSN: [id number] Arrival date & time: 09/21/21  1036     History  Chief Complaint  Patient presents with   Abdominal Pain    [name] is a 46 y.o. female with a known history of gallstones, GERD presenting to the ED with a chief complaint of epigastric pain.  Symptoms worsened last night and into today.  She has a history of reflux and typically symptoms related to this improved after taking her pantoprazole.  She took this as prescribed last night and this morning but continues to be symptomatic.  Started having severe worsening pain in the epigastric area with associated nausea.  No changes to bowel movements.  Denies any chest pain, shortness of breath, cough or fever. Patient underwent an EGD 2 months ago and was told that she had Barrett's esophagus.  She was also told that her gallbladder was "packed with gallstones."  She underwent a hysterectomy last month and inquired about having her gallbladder removed to but unfortunately surgery was unable to do this as they did not feel that she was symptomatic and that her symptoms were more so related to her GERD.   Abdominal Pain Associated symptoms: nausea   Associated symptoms: no chest pain, no chills, no constipation, no cough, no diarrhea, no dysuria, no fever, no hematuria, no shortness of breath, no sore throat and no vomiting       Home Medications Prior to Admission medications   Not on File      Allergies    Penicillins and Tape    Review of Systems   Review of Systems  Constitutional:  Negative for appetite change, chills and fever.  HENT:  Negative for ear pain, rhinorrhea, sneezing and sore throat.   Eyes:  Negative for photophobia and visual disturbance.  Respiratory:  Negative for cough, chest tightness, shortness of breath and wheezing.   Cardiovascular:  Negative for chest pain and palpitations.  Gastrointestinal:  Positive for abdominal pain  and nausea. Negative for blood in stool, constipation, diarrhea and vomiting.  Genitourinary:  Negative for dysuria, hematuria and urgency.  Musculoskeletal:  Negative for myalgias.  Skin:  Negative for rash.  Neurological:  Negative for dizziness, weakness and light-headedness.   Physical Exam Updated Vital Signs BP 119/77   Pulse 70   Temp 97.6 F (36.4 C) (Oral)   Resp 18   SpO2 100%  Physical Exam Vitals and nursing note reviewed.  Constitutional:      General: She is not in acute distress.    Appearance: She is well-developed.  HENT:     Head: Normocephalic and atraumatic.     Nose: Nose normal.  Eyes:     General: No scleral icterus.       Left eye: No discharge.     Conjunctiva/sclera: Conjunctivae normal.  Cardiovascular:     Rate and Rhythm: Normal rate and regular rhythm.     Heart sounds: Normal heart sounds. No murmur heard.   No friction rub. No gallop.  Pulmonary:     Effort: Pulmonary effort is normal. No respiratory distress.     Breath sounds: Normal breath sounds.  Abdominal:     General: Bowel sounds are normal. There is no distension.     Palpations: Abdomen is soft.     Tenderness: There is abdominal tenderness in the epigastric area. There is no guarding.  Musculoskeletal:        General: Normal range of motion.  Cervical back: Normal range of motion and neck supple.  Skin:    General: Skin is warm and dry.     Findings: No rash.  Neurological:     Mental Status: She is alert.     Motor: No abnormal muscle tone.     Coordination: Coordination normal.    ED Results / Procedures / Treatments   Labs (all labs ordered are listed, but only abnormal results are displayed) Labs Reviewed  COMPREHENSIVE METABOLIC PANEL - Abnormal; Notable for the following components:      Result Value   Potassium 3.2 (*)    Glucose, Bld 107 (*)    Calcium 8.4 (*)    AST 14 (*)    All other components within normal limits  CBC - Abnormal; Notable for the  following components:   RBC 3.68 (*)    Hemoglobin 11.8 (*)    HCT 34.7 (*)    All other components within normal limits  URINALYSIS, ROUTINE W REFLEX MICROSCOPIC - Abnormal; Notable for the following components:   APPearance HAZY (*)    Leukocytes,Ua SMALL (*)    All other components within normal limits  LIPASE, BLOOD  I-STAT BETA HCG BLOOD, ED ([facility], [facility], [facility] ONLY)    EKG EKG Interpretation  Date/Time:  Wednesday September 21 2021 13:10:20 EDT Ventricular Rate:  63 PR Interval:  189 QRS Duration: 98 QT Interval:  461 QTC Calculation: 472 R Axis:   33 Text Interpretation: Sinus rhythm RSR' in V1 or V2, right VCD or RVH Borderline T abnormalities, anterior leads No previous ECGs available Confirmed by Glynn Octaveancour, Stephen [phone] on 09/21/2021 1:19:03 PM  Radiology US Abdomen Limited RUQ (LIVER/GB)  Result Date: 09/21/2021 CLINICAL DATA:  Right upper quadrant pain EXAM: ULTRASOUND ABDOMEN LIMITED RIGHT UPPER QUADRANT COMPARISON:  Sonogram dated July 06, 2021 FINDINGS: Gallbladder: Redemonstration of wall echo shadow sign indicating a gallbladder completely filled with stones. No sonographic Murphy sign noted by sonographer. Common bile duct: Diameter: 4 mm Liver: No focal lesion identified. Within normal limits in parenchymal echogenicity. Portal vein is patent on color Doppler imaging with normal direction of blood flow towards the liver. Other: None. IMPRESSION: 1. Gallbladder is filled with multiple stones with wall-echo-shadow sign, unchanged. No evidence of acute cholecystitis. 2. No biliary ductal dilatation or appreciable hepatic lesion. Electronically Signed   By: Larose HiresImran  Ahmed D.O.   On: 09/21/2021 12:52    Procedures Procedures    Medications Ordered in ED Medications  sodium chloride 0.9 % bolus 1,000 mL (1,000 mLs Intravenous New Bag/Given 09/21/21 1155)  ondansetron (ZOFRAN) injection 4 mg (4 mg Intravenous Given 09/21/21 1155)  famotidine (PEPCID) IVPB 20 mg premix (20 mg Intravenous  New Bag/Given 09/21/21 1159)    ED Course/ Medical Decision Making/ A&P Clinical Course as of 09/21/21 1352  Wed Sep 21, 2021  1332 WBC: 6.5 [HK]    Clinical Course User Index [HK] Dietrich PatesKhatri, Kelton Bultman, PA-C                           Medical Decision Making Amount and/or Complexity of Data Reviewed Labs: ordered. Decision-making details documented in ED Course. Radiology: ordered.  Risk Prescription drug management.   46 year old female with a history of gallstones, GERD presenting to the ED with a chief complaint of epigastric pain.  Symptoms worsened last night and into today.  History of reflux and typically symptoms improve after taking pantoprazole.  Pain is epigastric with associated nausea.  No vomiting.  No chest pain.  Was told that she had multiple gallstones on recent ultrasound but no plans to remove her gallbladder per surgery. On exam there is tender palpation of the epigastric region without rebound or guarding.  Lungs are clear bilaterally.  She was given IV fentanyl prior to my evaluation by EMS.  EKG shows sinus rhythm, no STEMI.  Labs including CBC, CMP unremarkable.  Lipase is normal.  hCG is negative.  Right upper quadrant ultrasound shows multiple gallstones without evidence of cholecystitis.  Patient with resolution of her symptoms with Zofran and Pepcid given.  Suspect that symptoms could be due to reflux. Doubt cardiac cause.  We will have her follow-up with GI, general surgery and return for any worsening symptoms.   Patient is hemodynamically stable, in NAD, and able to ambulate in the ED. Evaluation does not show pathology that would require ongoing emergent intervention or inpatient treatment. I explained the diagnosis to the patient. Pain has been managed and has no complaints prior to discharge. Patient is comfortable with above plan and is stable for discharge at this time. All questions were answered prior to disposition. Strict return precautions for returning to the  ED were discussed. Encouraged follow up with PCP.   An After Visit Summary was printed and given to the patient.   Portions of this note were generated with Scientist, clinical (histocompatibility and immunogenetics). Dictation errors may occur despite best attempts at proofreading.        Final Clinical Impression(s) / ED Diagnoses Final diagnoses:  Gastritis without bleeding, unspecified chronicity, unspecified gastritis type  Gastroesophageal reflux disease, unspecified whether esophagitis present    Rx / DC Orders ED Discharge Orders     None         Dietrich Pates, PA-C 09/21/21 1352    Glynn Octave, MD 09/21/21 1655

## 2021-09-21 NOTE — Discharge Instructions (Addendum)
Continue taking your home medications as previously prescribed. It is important for you to follow-up with a GI doctor as well as a general surgeon to evaluate for your symptoms.  Your ultrasound showed that you have many gallstones in your gallbladder but no signs of inflammation or infection. Return to the ER if you start to experience fever, chest pain, shortness of breath, uncontrollable vomiting or severe worsening pain

## 2021-09-21 NOTE — ED Triage Notes (Signed)
Pt BIB EMS from doctors office. Pt woke up at 3am this morning with 10 out of 10 abdominal pain. Pt had hysterectomy about a month ago and they found multiple gallstones at that time. Pt also endorses N/V. Pt took Zofran prior to EMS arrival.   14mcg Fentanyl  18G LAC

## 2022-03-07 ENCOUNTER — Other Ambulatory Visit: Payer: Self-pay

## 2022-03-07 ENCOUNTER — Emergency Department (HOSPITAL_BASED_OUTPATIENT_CLINIC_OR_DEPARTMENT_OTHER): Payer: PRIVATE HEALTH INSURANCE

## 2022-03-07 ENCOUNTER — Emergency Department (HOSPITAL_BASED_OUTPATIENT_CLINIC_OR_DEPARTMENT_OTHER)
Admission: EM | Admit: 2022-03-07 | Discharge: 2022-03-07 | Disposition: A | Discharge status: Home / Self Care | Payer: PRIVATE HEALTH INSURANCE | Attending: Emergency Medicine | Admitting: Emergency Medicine

## 2022-03-07 ENCOUNTER — Encounter (HOSPITAL_BASED_OUTPATIENT_CLINIC_OR_DEPARTMENT_OTHER): Payer: Self-pay | Admitting: Emergency Medicine

## 2022-03-07 DIAGNOSIS — E876 Hypokalemia: Secondary | ICD-10-CM | POA: Insufficient documentation

## 2022-03-07 DIAGNOSIS — R1010 Upper abdominal pain, unspecified: Secondary | ICD-10-CM | POA: Diagnosis present

## 2022-03-07 DIAGNOSIS — K2900 Acute gastritis without bleeding: Secondary | ICD-10-CM | POA: Diagnosis not present

## 2022-03-07 LAB — COMPREHENSIVE METABOLIC PANEL
ALT: 20 U/L (ref 0–44)
AST: 12 U/L — ABNORMAL LOW (ref 15–41)
Albumin: 4.3 g/dL (ref 3.5–5.0)
Alkaline Phosphatase: 70 U/L (ref 38–126)
Anion gap: 10 (ref 5–15)
BUN: 9 mg/dL (ref 6–20)
CO2: 29 mmol/L (ref 22–32)
Calcium: 9.1 mg/dL (ref 8.9–10.3)
Chloride: 102 mmol/L (ref 98–111)
Creatinine, Ser: 0.77 mg/dL (ref 0.44–1.00)
GFR, Estimated: >60 mL/min (ref >60)
Glucose, Bld: 99 mg/dL (ref 70–99)
Potassium: 3.2 mmol/L — ABNORMAL LOW (ref 3.5–5.1)
Sodium: 141 mmol/L (ref 135–145)
Total Bilirubin: 0.4 mg/dL (ref 0.3–1.2)
Total Protein: 6.8 g/dL (ref 6.5–8.1)

## 2022-03-07 LAB — LIPASE, BLOOD: Lipase: 44 U/L (ref 11–51)

## 2022-03-07 LAB — CBC
HCT: 35.4 % — ABNORMAL LOW (ref 36.0–46.0)
Hemoglobin: 12.3 g/dL (ref 12.0–15.0)
MCH: 32.2 pg (ref 26.0–34.0)
MCHC: 34.7 g/dL (ref 30.0–36.0)
MCV: 92.7 fL (ref 80.0–100.0)
Platelets: 274 10*3/uL (ref 150–400)
RBC: 3.82 MIL/uL — ABNORMAL LOW (ref 3.87–5.11)
RDW: 12.6 % (ref 11.5–15.5)
WBC: 8.3 10*3/uL (ref 4.0–10.5)
nRBC: 0 % (ref 0.0–0.2)

## 2022-03-07 MED ORDER — SODIUM CHLORIDE 0.9 % IV BOLUS
1000.0000 mL | Freq: Once | INTRAVENOUS | Status: AC
  Administered 2022-03-07: 1000 mL via INTRAVENOUS

## 2022-03-07 MED ORDER — ALUM & MAG HYDROXIDE-SIMETH 200-200-20 MG/5ML PO SUSP
30.0000 mL | Freq: Once | ORAL | Status: AC
  Administered 2022-03-07: 30 mL via ORAL
  Filled 2022-03-07: qty 30

## 2022-03-07 MED ORDER — ONDANSETRON HCL 4 MG/2ML IJ SOLN
4.0000 mg | Freq: Once | INTRAMUSCULAR | Status: AC | PRN
Start: 2022-03-07 — End: 2022-03-07
  Administered 2022-03-07: 4 mg via INTRAVENOUS
  Filled 2022-03-07: qty 2

## 2022-03-07 MED ORDER — POTASSIUM CHLORIDE CRYS ER 20 MEQ PO TBCR
40.0000 meq | EXTENDED_RELEASE_TABLET | Freq: Once | ORAL | Status: AC
  Administered 2022-03-07: 40 meq via ORAL
  Filled 2022-03-07: qty 2

## 2022-03-07 MED ORDER — ONDANSETRON 4 MG PO TBDP: 4.0000 mg | ORAL_TABLET | Freq: Three times a day (TID) | ORAL | 0 refills | Status: AC | PRN

## 2022-03-07 NOTE — Discharge Instructions (Addendum)
If you develop worsening, continued, or recurrent abdominal pain, uncontrolled vomiting, fever, chest or back pain, or any other new/concerning symptoms then return to the ER for evaluation.  

## 2022-03-07 NOTE — ED Triage Notes (Signed)
Pt arrives to ED with c/o epigastric pain this morning.

## 2022-03-07 NOTE — ED Provider Notes (Signed)
[facility] EMERGENCY DEPT Provider Note   CSN: [id number] Arrival date & time: 03/07/22  0750     History  Chief Complaint  Patient presents with   Abdominal Pain    [name] is a 46 y.o. female.  HPI 46 year old female with a history of Barrett's esophagus and gastritis as well as a known history of gallstones presents with upper abdominal pain.  Started at around 2 or 3 AM.  It was severe and she vomited once.  The pain is not as bad currently but still pretty painful.  Feels like a dull sensation.  This feels like when she has had gastritis before.  She is supposed to be on Protonix twice daily though recently she has been feeling better with diet changes and has not been taking that as consistently.  She did take Protonix twice today.  Otherwise, there was no hematemesis.  There is no back pain, chest pain.  Home Medications Prior to Admission medications   Medication Sig Start Date End Date Taking? Authorizing Provider  ondansetron (ZOFRAN-ODT) 4 MG disintegrating tablet Take 1 tablet (4 mg total) by mouth every 8 (eight) hours as needed for nausea or vomiting. 03/07/22  Yes Pricilla Loveless, MD  buPROPion (WELLBUTRIN XL) 300 MG 24 hr tablet Take 300 mg by mouth daily. 03/02/22   [provider]  pantoprazole (PROTONIX) 40 MG tablet Take 40 mg by mouth 2 (two) times daily. 01/17/22   [provider]  WEGOVY 1.7 MG/0.75ML SOAJ  02/09/22   [provider]      Allergies    Penicillins and Tape    Review of Systems   Review of Systems  Gastrointestinal:  Positive for abdominal pain and vomiting.    Physical Exam Updated Vital Signs BP 121/82 (BP Location: Left Arm)   Pulse 78   Temp 98.1 F (36.7 C) (Oral)   Resp 14   Ht 5\' 3"  (1.6 m)   Wt 79.4 kg   SpO2 99%   BMI 31.00 kg/m  Physical Exam Vitals and nursing note reviewed.  Constitutional:      Appearance: She is well-developed.  HENT:     Head: Normocephalic and  atraumatic.  Cardiovascular:     Rate and Rhythm: Normal rate and regular rhythm.     Heart sounds: Normal heart sounds.  Pulmonary:     Effort: Pulmonary effort is normal.     Breath sounds: Normal breath sounds.  Abdominal:     Palpations: Abdomen is soft.     Tenderness: There is abdominal tenderness in the epigastric area and left upper quadrant.  Skin:    General: Skin is warm and dry.  Neurological:     Mental Status: She is alert.     ED Results / Procedures / Treatments   Labs (all labs ordered are listed, but only abnormal results are displayed) Labs Reviewed  COMPREHENSIVE METABOLIC PANEL - Abnormal; Notable for the following components:      Result Value   Potassium 3.2 (*)    AST 12 (*)    All other components within normal limits  CBC - Abnormal; Notable for the following components:   RBC 3.82 (*)    HCT 35.4 (*)    All other components within normal limits  LIPASE, BLOOD  URINALYSIS, ROUTINE W REFLEX MICROSCOPIC    EKG None  Radiology Abdomen Limited RUQ (LIVER/GB)  Result Date: 03/07/2022 CLINICAL DATA:  03/09/2022 Upper abdominal pain [id number] EXAM: ULTRASOUND ABDOMEN LIMITED  RIGHT UPPER QUADRANT COMPARISON:  Ultrasound 09/21/2021 FINDINGS: Gallbladder: Cholelithiasis with multiple intraluminal stones, largest measuring up to 7 mm. Gallbladder is nondilated. No wall thickening. Negative sonographic Murphy sign. Common bile duct: Diameter: 4.3 mm, normal.  No intrahepatic ductal dilation. Liver: No focal lesion identified. Within normal limits in parenchymal echogenicity. Portal vein is patent on color Doppler imaging with normal direction of blood flow towards the liver. Other: None. IMPRESSION: Cholelithiasis with multiple intraluminal stones, largest measuring up to 7 mm. No evidence of acute cholecystitis. Electronically Signed   By: Caprice Renshaw M.D.   On: 03/07/2022 09:15    Procedures Procedures    Medications Ordered in ED Medications  potassium  chloride SA (KLOR-CON M) CR tablet 40 mEq (has no administration in time range)  alum & mag hydroxide-simeth (MAALOX/MYLANTA) 200-200-20 MG/5ML suspension 30 mL (30 mLs Oral Given 03/07/22 0828)  sodium chloride 0.9 % bolus 1,000 mL (1,000 mLs Intravenous New Bag/Given 03/07/22 0830)  ondansetron (ZOFRAN) injection 4 mg (4 mg Intravenous Given 03/07/22 0831)    ED Course/ Medical Decision Making/ A&P                           Medical Decision Making Amount and/or Complexity of Data Reviewed External Data Reviewed: notes. Labs: ordered.    Details: Mild hypokalemia.  Normal hemoglobin and WBC.  Normal lipase Radiology: ordered and independent interpretation performed.    Details: Gallstones but no cholecystitis  Risk OTC drugs. Prescription drug management.   Patient has a known history of gastritis and follows with GI at [facility].  Otherwise, has some mild tenderness but is well-appearing and has normal vitals.  She was given GI cocktail and Zofran and fluids.  She is feeling a lot better and feels well enough for discharge.  We have discussed the importance of compliance with her prescribed medicines and discussing with her GI doctor prior to stopping these.  Otherwise, I do not think an emergent CT is needed and I do not think this cholelithiasis is the cause of her symptoms with no signs of cholecystitis.  Will discharge home with return precautions and a prescription for Zofran.        Final Clinical Impression(s) / ED Diagnoses Final diagnoses:  Acute gastritis without hemorrhage, unspecified gastritis type    Rx / DC Orders ED Discharge Orders          Ordered    ondansetron (ZOFRAN-ODT) 4 MG disintegrating tablet  Every 8 hours PRN        03/07/22 7001              Pricilla Loveless, MD 03/07/22 [id number]

## 2022-03-07 NOTE — ED Notes (Signed)
Discharge instructions, follow up care, and prescription reviewed and explained, pt verbalized understanding. Pt caox4 and ambulatory on d/c.  

## 2023-12-07 IMAGING — US US ABDOMEN LIMITED
1 series · 15 of 25 positions shown · non-contrast
Comparison: Sonogram dated July 06, 2021

CLINICAL DATA: Right upper quadrant pain

EXAM:
ULTRASOUND ABDOMEN LIMITED RIGHT UPPER QUADRANT

[Series 1: us abdomen limited ruq [facility] & [facility] · 15 of 50 slices shown]
[im 1/50]
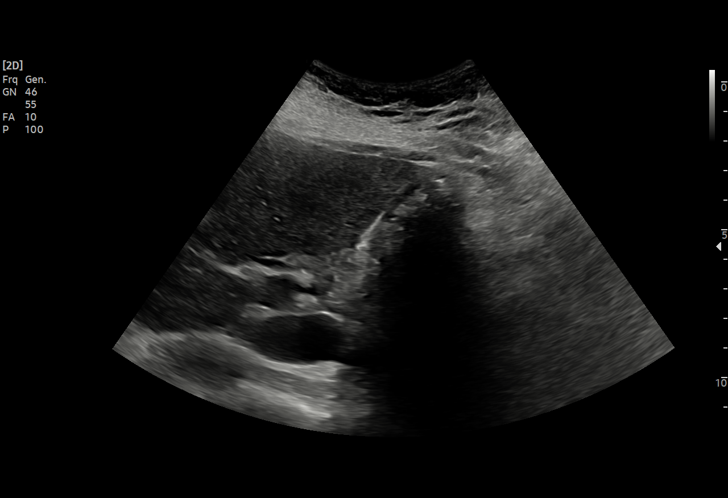
[im 5/50]
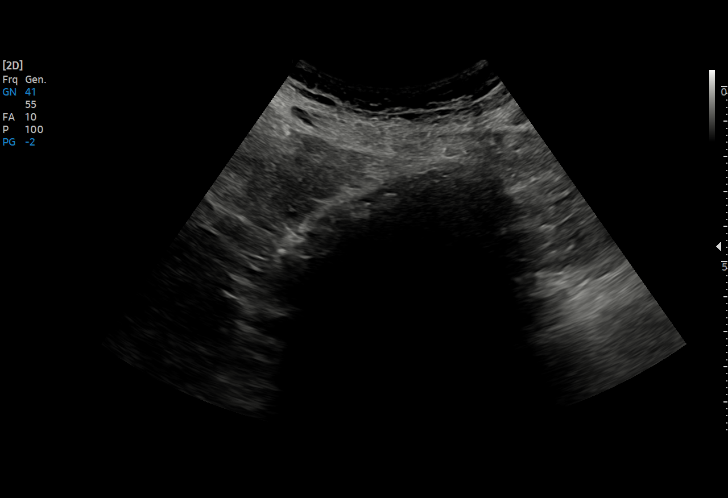
[im 9/50]
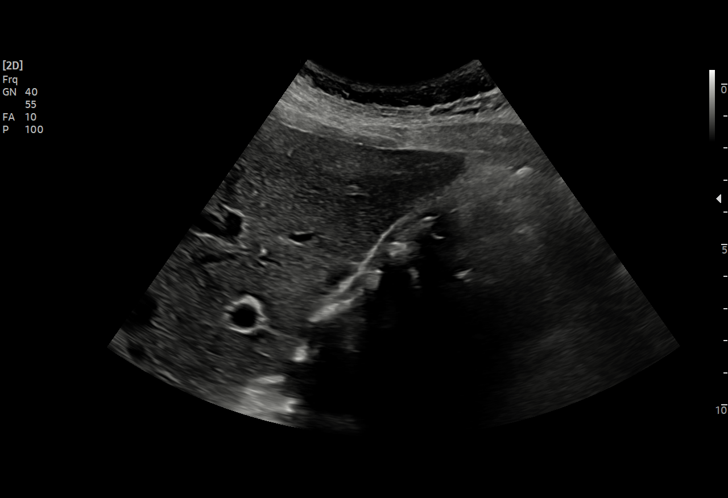
[im 11/50]
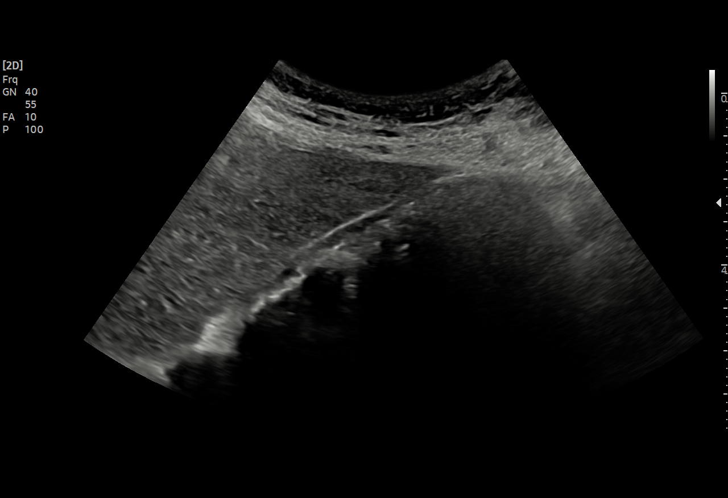
[im 15/50]
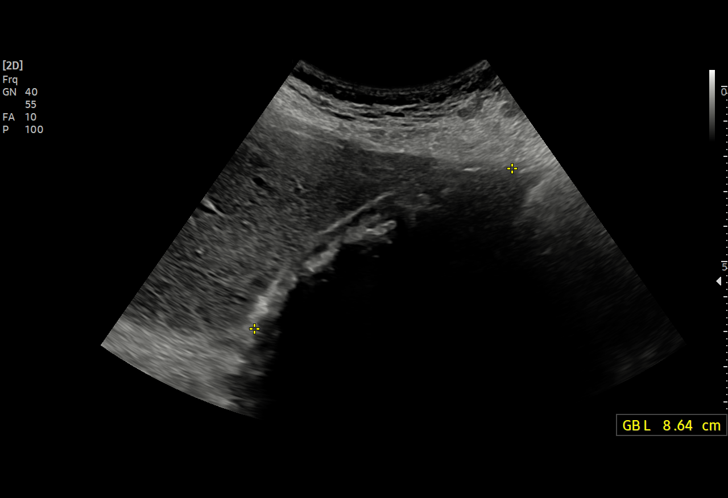
[im 19/50]
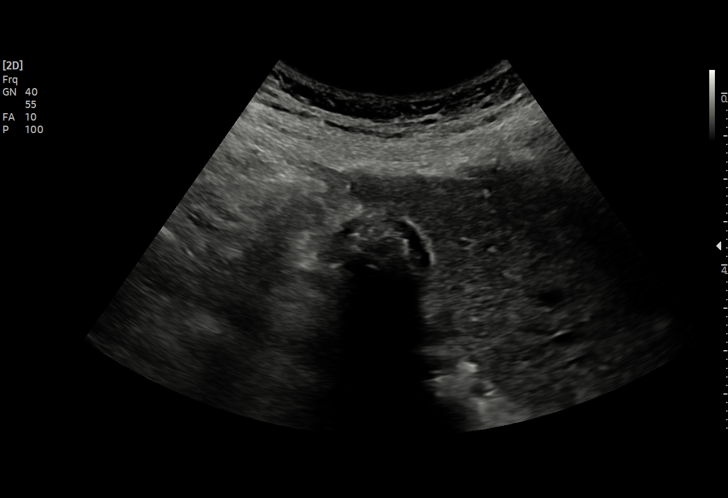
[im 21/50]
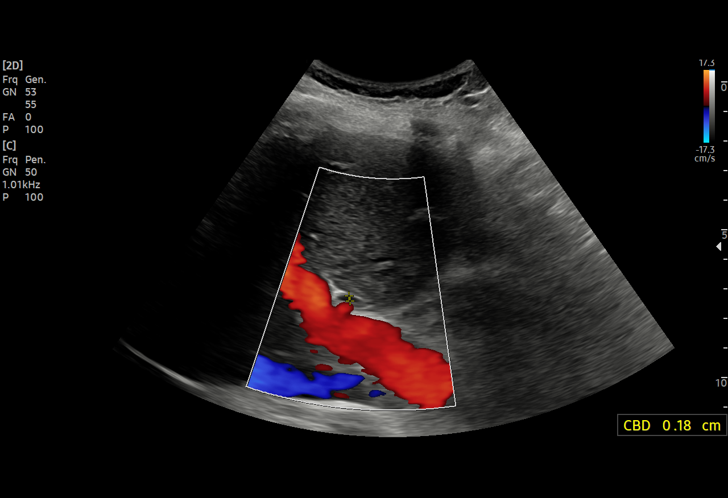
[im 25/50]
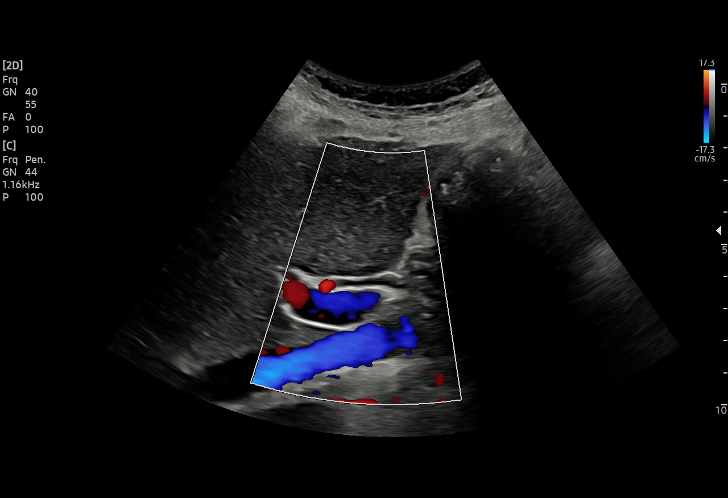
[im 29/50]
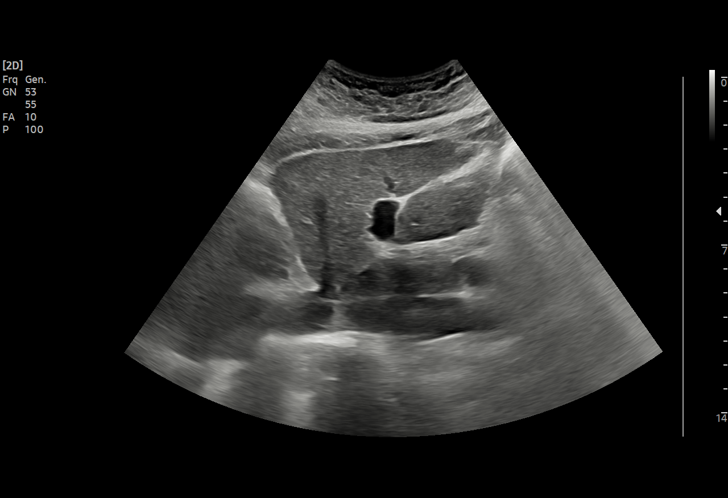
[im 31/50]
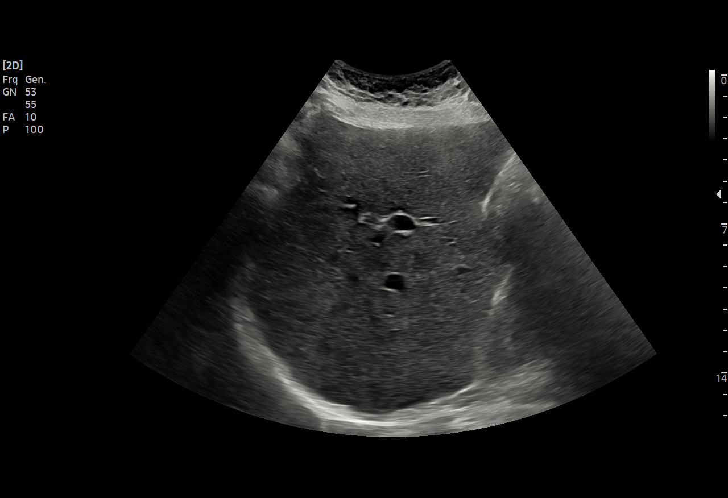
[im 35/50]
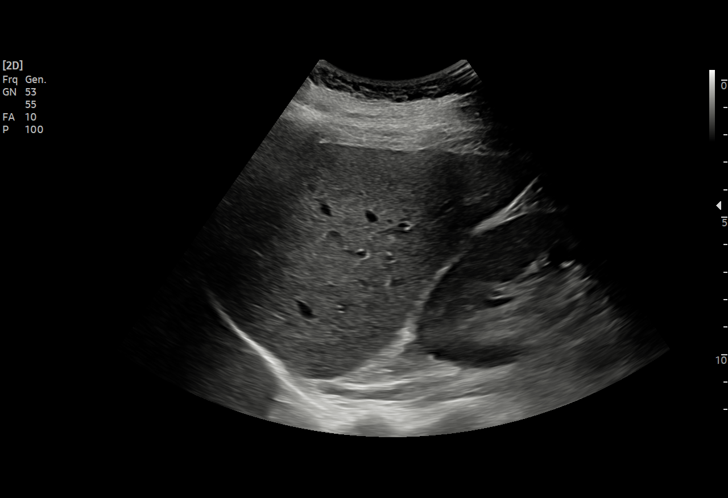
[im 39/50]
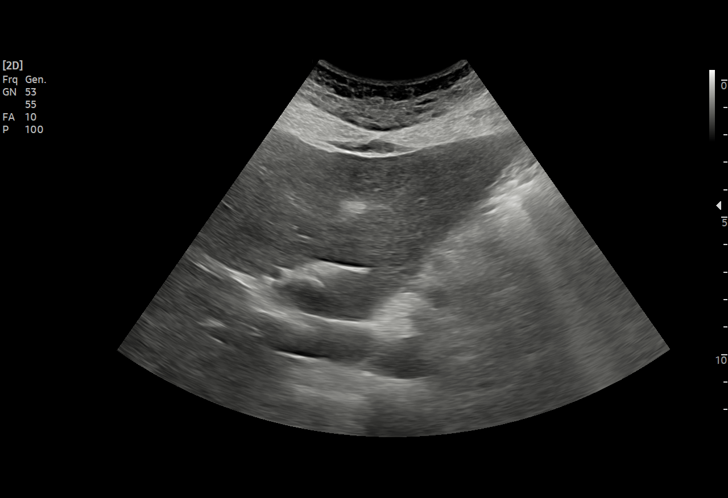
[im 41/50]
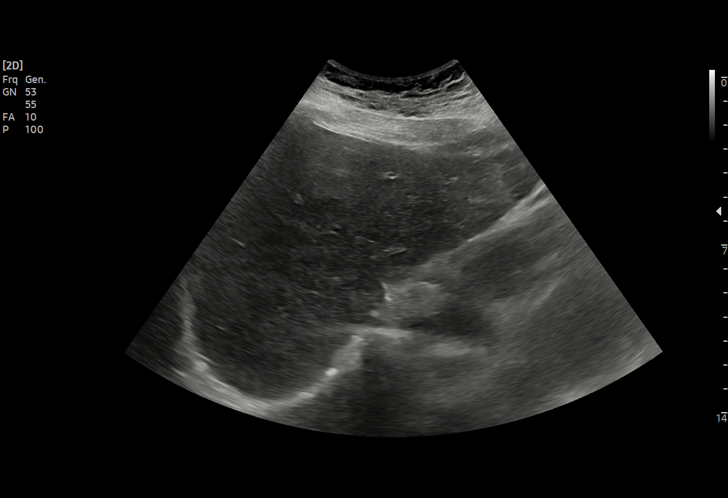
[im 45/50]
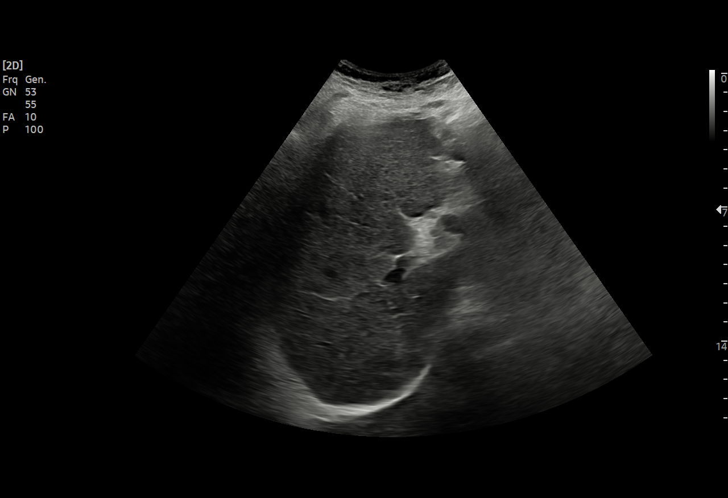
[im 50/50]
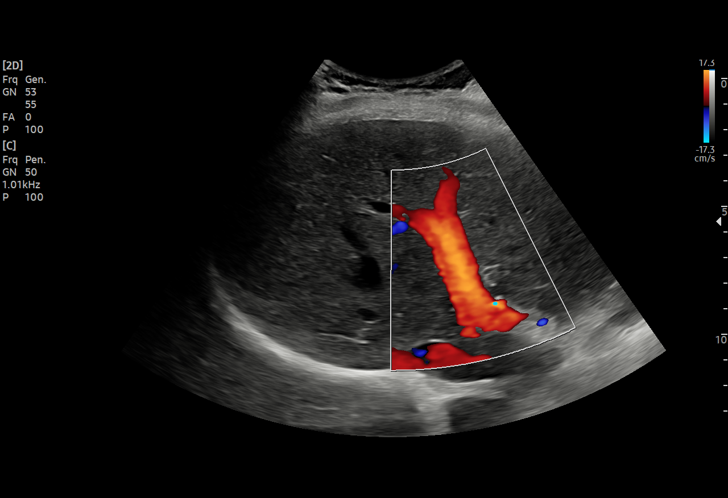

[15 of 25 positions shown; findings below may reference images not displayed]

FINDINGS: Gallbladder:

Redemonstration of wall echo shadow sign indicating a gallbladder
completely filled with stones. No sonographic Murphy sign noted by
sonographer.

Common bile duct:

Diameter: 4 mm

Liver:

No focal lesion identified. Within normal limits in parenchymal
echogenicity. Portal vein is patent on color Doppler imaging with
normal direction of blood flow towards the liver.

Other: None.
IMPRESSION: 1. Gallbladder is filled with multiple stones with wall-echo-shadow
sign, unchanged. No evidence of acute cholecystitis.
2. No biliary ductal dilatation or appreciable hepatic lesion.
